# Patient Record
Sex: Male | Born: 2000 | Race: Black or African American | Hispanic: No | Marital: Single | Smoking: Never smoker
Health system: Southern US, Community
[De-identification: ages and names within clinical notes are randomized; demographics above are authoritative.]

## PROBLEM LIST (undated history)

## (undated) DIAGNOSIS — I517 Cardiomegaly: Secondary | ICD-10-CM

## (undated) HISTORY — PX: CIRCUMCISION: SUR203

---
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
## 2020-08-11 ENCOUNTER — Emergency Department: Payer: Self-pay

## 2020-08-11 ENCOUNTER — Other Ambulatory Visit: Payer: Self-pay

## 2020-08-11 DIAGNOSIS — Y92410 Unspecified street and highway as the place of occurrence of the external cause: Secondary | ICD-10-CM | POA: Diagnosis not present

## 2020-08-11 DIAGNOSIS — R519 Headache, unspecified: Secondary | ICD-10-CM | POA: Insufficient documentation

## 2020-08-11 DIAGNOSIS — S161XXA Strain of muscle, fascia and tendon at neck level, initial encounter: Secondary | ICD-10-CM | POA: Diagnosis not present

## 2020-08-11 DIAGNOSIS — S199XXA Unspecified injury of neck, initial encounter: Secondary | ICD-10-CM | POA: Diagnosis present

## 2020-08-11 DIAGNOSIS — M79652 Pain in left thigh: Secondary | ICD-10-CM | POA: Diagnosis not present
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
## 2020-08-12 ENCOUNTER — Emergency Department
Admission: EM | Admit: 2020-08-12 | Discharge: 2020-08-12 | Disposition: A | Discharge status: Home / Self Care | Payer: Self-pay | Attending: Emergency Medicine | Admitting: Emergency Medicine

## 2020-08-12 DIAGNOSIS — S161XXA Strain of muscle, fascia and tendon at neck level, initial encounter: Secondary | ICD-10-CM

## 2020-08-12 MED ORDER — IBUPROFEN 800 MG PO TABS
800.0000 mg | ORAL_TABLET | Freq: Once | ORAL | Status: AC
  Administered 2020-08-12: 800 mg via ORAL
  Filled 2020-08-12: qty 1

## 2020-08-12 MED ORDER — IBUPROFEN 800 MG PO TABS: 800.0000 mg | ORAL_TABLET | Freq: Three times a day (TID) | ORAL | 0 refills | Status: DC | PRN

## 2020-08-12 MED ORDER — CYCLOBENZAPRINE HCL 5 MG PO TABS: 5.0000 mg | ORAL_TABLET | Freq: Three times a day (TID) | ORAL | 0 refills | Status: DC | PRN

## 2020-08-12 MED ORDER — CYCLOBENZAPRINE HCL 10 MG PO TABS
5.0000 mg | ORAL_TABLET | Freq: Once | ORAL | Status: AC
  Administered 2020-08-12: 5 mg via ORAL
  Filled 2020-08-12: qty 1
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
## 2020-08-12 NOTE — ED Notes (Signed)
Patient ambulated to triage room without complaint. NAD noted.

## 2020-08-12 NOTE — Discharge Instructions (Signed)
1.  You may take Motrin and Flexeril as needed for pain and muscle spasms. 2.  Apply moist heat to affected area several times daily. 3.  Return to the ER for worsening symptoms, persistent vomiting, extremity numbness/tingling, difficulty breathing or other concerns.
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
## 2023-02-12 ENCOUNTER — Ambulatory Visit
Admission: RE | Admit: 2023-02-12 | Discharge: 2023-02-12 | Disposition: A | Discharge status: Home / Self Care | Payer: Medicaid Other | Source: Ambulatory Visit | Attending: Urgent Care | Admitting: Urgent Care

## 2023-02-12 ENCOUNTER — Ambulatory Visit: Payer: Self-pay

## 2023-02-12 VITALS — BP 144/85 | HR 77 | Temp 98.2°F | Resp 16

## 2023-02-12 DIAGNOSIS — M79662 Pain in left lower leg: Secondary | ICD-10-CM

## 2023-02-12 HISTORY — DX: Cardiomegaly: I51.7

## 2023-02-12 LAB — POCT URINALYSIS DIP (MANUAL ENTRY)
Bilirubin, UA: NEGATIVE
Glucose, UA: NEGATIVE mg/dL
Ketones, POC UA: NEGATIVE mg/dL
Nitrite, UA: NEGATIVE
Protein Ur, POC: 30 mg/dL — AB
Spec Grav, UA: >=1.03 — AB (ref 1.010–1.025)
Urobilinogen, UA: 0.2 E.U./dL
pH, UA: 5.5 (ref 5.0–8.0)

## 2023-02-12 NOTE — Discharge Instructions (Addendum)
The symptoms in your left leg need further evaluation, possibly including imaging.    We have drawn blood today to rule out an electrolyte imbalance.    Your options for additional evaluation are:  - establish care with a primary care doctor who can order additional tests for you.  Given your insurance is Medicaid, you have to have a provider assigned to you at the health department.  -  go to the emergency room and request evaluation of your symptoms

## 2023-02-12 NOTE — ED Provider Notes (Addendum)
Sergio Graham    CSN: [id number] Arrival date & time: 02/12/23  0854      History   Chief Complaint Chief Complaint  Patient presents with   Leg Injury    Entered by patient    HPI Sergio Graham is a 22 y.o. male.   HPI  Presents to UC with complaint of pain and cramping in the left posterior calf x 1 month.  Describes pain as "cramping" with waxing and waning intensity.  He states that the pain radiates into his lower back.  Pain is somewhat relieved by elevation however he feels a pulsing sensation when in a dependent position.  He denies warmth or swelling in the leg.  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 Authorizing Provider  cyclobenzaprine (FLEXERIL) 5 MG tablet Take 1 tablet (5 mg total) by mouth 3 (three) times daily as needed. 08/12/20   Irean Hong, MD  hydrocortisone cream 0.5 % Apply 1 application topically 2 (two) times daily. Patient not taking: Reported on 06/09/2018 12/12/15   Evangeline Dakin, PA-C  ibuprofen (ADVIL) 800 MG tablet Take 1 tablet (800 mg total) by mouth every 8 (eight) hours as needed for moderate pain. 08/12/20   Irean Hong, MD  naproxen (NAPROSYN) 500 MG tablet Take 1 tablet (500 mg total) by mouth 2 (two) times daily with a meal. Patient not taking: Reported on 06/09/2018 09/21/17   Joni Reining, PA-C  sulfamethoxazole-trimethoprim (BACTRIM DS,SEPTRA DS) 800-160 MG tablet Take 1 tablet by mouth 2 (two) times daily. Patient not taking: Reported on 06/09/2018 09/21/17   Joni Reining, PA-C    Family History Family History  Problem  Relation Age of Onset   Seizures Father    Migraines Neg Hx    Autism Neg Hx    ADD / ADHD Neg Hx    Anxiety disorder Neg Hx    Depression Neg Hx    Bipolar disorder Neg Hx    Schizophrenia Neg Hx     Social History Social History   Tobacco Use   Smoking status: Never   Smokeless tobacco: Never  Substance Use Topics   Alcohol use: No     Allergies   Patient has no known allergies.   Review of Systems Review of Systems   Physical Exam Triage Vital Signs ED Triage Vitals  Encounter Vitals Group     BP 02/12/23 0906 (!) 144/85     Systolic BP Percentile --      Diastolic BP Percentile --      Pulse Rate 02/12/23 0906 77     Resp 02/12/23 0906 16     Temp 02/12/23 0906 98.2 F (36.8 C)     Temp Source 02/12/23 0906 Oral     SpO2 02/12/23 0906 97 %     Weight --      Height --      Head Circumference --      Peak Flow --      Pain Score 02/12/23 0911 6  Pain Loc --      Pain Education --      Exclude from Growth Chart --    No data found.  Updated Vital Signs BP (!) 144/85 (BP Location: Right Arm)   Pulse 77   Temp 98.2 F (36.8 C) (Oral)   Resp 16   SpO2 97%   Visual Acuity Right Eye Distance:   Left Eye Distance:   Bilateral Distance:    Right Eye Near:   Left Eye Near:    Bilateral Near:     Physical Exam Vitals reviewed.  Cardiovascular:     Rate and Rhythm: Normal rate and regular rhythm.     Pulses: Normal pulses.     Heart sounds: Normal heart sounds.  Pulmonary:     Effort: Pulmonary effort is normal.     Breath sounds: Normal breath sounds.  Musculoskeletal:     Left lower leg: No swelling or tenderness. No edema.  Skin:    General: Skin is warm and dry.     Comments: Pedal pulses are present in the left foot.  He has good skin color.  Tissue is warm.  < 3-second capillary refill  Neurological:     General: No focal deficit present.     Mental Status: He is alert and oriented to person, place, and time.  Psychiatric:         Mood and Affect: Mood normal.        Behavior: Behavior normal.      UC Treatments / Results  Labs (all labs ordered are listed, but only abnormal results are displayed) Labs Reviewed - No data to display  EKG   Radiology No results found.  Procedures Procedures (including critical care time)  Medications Ordered in UC Medications - No data to display  Initial Impression / Assessment and Plan / UC Course  I have reviewed the triage vital signs and the nursing notes.  Pertinent labs & imaging results that were available during my care of the patient were reviewed by me and considered in my medical decision making (see chart for details).   Sergio Graham is a 22 y.o. male presenting with LLE pain. Patient is afebrile without recent antipyretics, satting well on room air. Overall is well appearing, well hydrated, without respiratory distress. Pulmonary exam is unremarkable.  Lungs CTAB without wheezing, rhonchi, rales. RRR without murmurs, rubs, gallops. Pedal pulses are present in the left foot.  He has good skin color.  Tissue is warm.  < 3-second capillary refill at his toes.  No lower left extremity tenderness or swelling.  Reviewed relevant chart history.   Unclear etiology for his symptoms.  Differential diagnoses include dehydration versus electrolyte imbalance.  More serious etiology would include DVT versus claudication which will require imaging not available in UC, followed by possible referral to vascular specialist.  Will draw labs and urine to assess hydration status and electrolytes.  Will recommend that patient follow-up with establishing care with a primary care provider or go to the ED for emergent evaluation.  Final Clinical Impressions(s) / UC Diagnoses   Final diagnoses:  None   Discharge Instructions   None    ED Prescriptions   None    PDMP not reviewed this encounter.   Charma Igo, FNP 02/12/23 0954    Charma Igo,  FNP 02/12/23 [phone]

## 2023-02-12 NOTE — ED Triage Notes (Signed)
Reports pain and cramping sensation in left posterior calf/behind knee x 1 month. Describes the pain as cramping in nature, constantly there but waxes and wanes in severity. Radiates into lower back. Elevating leg relieves some of the pain, but when in a dependent position the pain returns and becomes pulsating in nature. Denies warmth, swelling in leg. Denies fever, cough, SOB, palpitations, prolonged periods of sitting. 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 Uses ibuprofen and tylenol with minimal pain relief.

## 2023-02-13 LAB — CBC WITH DIFFERENTIAL/PLATELET
Basophils Absolute: 0 10*3/uL (ref 0.0–0.2)
Basos: 0 %
EOS (ABSOLUTE): 0.1 10*3/uL (ref 0.0–0.4)
Eos: 2 %
Hematocrit: 45.2 % (ref 37.5–51.0)
Hemoglobin: 15.1 g/dL (ref 13.0–17.7)
Immature Grans (Abs): 0 10*3/uL (ref 0.0–0.1)
Immature Granulocytes: 0 %
Lymphocytes Absolute: 2.1 10*3/uL (ref 0.7–3.1)
Lymphs: 34 %
MCH: 30.1 pg (ref 26.6–33.0)
MCHC: 33.4 g/dL (ref 31.5–35.7)
MCV: 90 fL (ref 79–97)
Monocytes Absolute: 0.6 10*3/uL (ref 0.1–0.9)
Monocytes: 9 %
Neutrophils Absolute: 3.4 10*3/uL (ref 1.4–7.0)
Neutrophils: 55 %
Platelets: 213 10*3/uL (ref 150–450)
RBC: 5.02 x10E6/uL (ref 4.14–5.80)
RDW: 12.7 % (ref 11.6–15.4)
WBC: 6.3 10*3/uL (ref 3.4–10.8)

## 2023-02-13 LAB — COMPREHENSIVE METABOLIC PANEL
ALT: 37 IU/L (ref 0–44)
AST: 25 IU/L (ref 0–40)
Albumin: 4.6 g/dL (ref 4.3–5.2)
Alkaline Phosphatase: 101 IU/L (ref 44–121)
BUN/Creatinine Ratio: 11 (ref 9–20)
BUN: 10 mg/dL (ref 6–20)
Bilirubin Total: 0.6 mg/dL (ref 0.0–1.2)
CO2: 24 mmol/L (ref 20–29)
Calcium: 9.6 mg/dL (ref 8.7–10.2)
Chloride: 103 mmol/L (ref 96–106)
Creatinine, Ser: 0.91 mg/dL (ref 0.76–1.27)
Globulin, Total: 2.6 g/dL (ref 1.5–4.5)
Glucose: 97 mg/dL (ref 70–99)
Potassium: 3.9 mmol/L (ref 3.5–5.2)
Sodium: 140 mmol/L (ref 134–144)
Total Protein: 7.2 g/dL (ref 6.0–8.5)
eGFR: 123 mL/min/{1.73_m2} (ref >59)

## 2023-02-13 LAB — MAGNESIUM: Magnesium: 2 mg/dL (ref 1.6–2.3)

## 2023-02-15 ENCOUNTER — Emergency Department
Admission: EM | Admit: 2023-02-15 | Discharge: 2023-02-15 | Disposition: A | Discharge status: Home / Self Care | Payer: Medicaid Other | Attending: Student in an Organized Health Care Education/Training Program | Admitting: Student in an Organized Health Care Education/Training Program

## 2023-02-15 ENCOUNTER — Emergency Department: Payer: Medicaid Other

## 2023-02-15 ENCOUNTER — Other Ambulatory Visit: Payer: Self-pay

## 2023-02-15 ENCOUNTER — Encounter: Payer: Self-pay | Admitting: Emergency Medicine

## 2023-02-15 DIAGNOSIS — S86912A Strain of unspecified muscle(s) and tendon(s) at lower leg level, left leg, initial encounter: Secondary | ICD-10-CM | POA: Diagnosis not present

## 2023-02-15 DIAGNOSIS — M79605 Pain in left leg: Secondary | ICD-10-CM | POA: Diagnosis present

## 2023-02-15 DIAGNOSIS — X58XXXA Exposure to other specified factors, initial encounter: Secondary | ICD-10-CM | POA: Insufficient documentation

## 2023-02-15 LAB — COMPREHENSIVE METABOLIC PANEL
ALT: 36 U/L (ref 0–44)
AST: 22 U/L (ref 15–41)
Albumin: 3.8 g/dL (ref 3.5–5.0)
Alkaline Phosphatase: 77 U/L (ref 38–126)
Anion gap: 7 (ref 5–15)
BUN: 17 mg/dL (ref 6–20)
CO2: 24 mmol/L (ref 22–32)
Calcium: 8.9 mg/dL (ref 8.9–10.3)
Chloride: 107 mmol/L (ref 98–111)
Creatinine, Ser: 0.84 mg/dL (ref 0.61–1.24)
GFR, Estimated: >60 mL/min (ref >60)
Glucose, Bld: 96 mg/dL (ref 70–99)
Potassium: 3.7 mmol/L (ref 3.5–5.1)
Sodium: 138 mmol/L (ref 135–145)
Total Bilirubin: 0.9 mg/dL (ref 0.3–1.2)
Total Protein: 7.5 g/dL (ref 6.5–8.1)

## 2023-02-15 LAB — CBC WITH DIFFERENTIAL/PLATELET
Abs Immature Granulocytes: 0.01 10*3/uL (ref 0.00–0.07)
Basophils Absolute: 0 10*3/uL (ref 0.0–0.1)
Basophils Relative: 0 %
Eosinophils Absolute: 0.2 10*3/uL (ref 0.0–0.5)
Eosinophils Relative: 3 %
HCT: 41.1 % (ref 39.0–52.0)
Hemoglobin: 14.3 g/dL (ref 13.0–17.0)
Immature Granulocytes: 0 %
Lymphocytes Relative: 46 %
Lymphs Abs: 2.4 10*3/uL (ref 0.7–4.0)
MCH: 30.8 pg (ref 26.0–34.0)
MCHC: 34.8 g/dL (ref 30.0–36.0)
MCV: 88.6 fL (ref 80.0–100.0)
Monocytes Absolute: 0.4 10*3/uL (ref 0.1–1.0)
Monocytes Relative: 8 %
Neutro Abs: 2.2 10*3/uL (ref 1.7–7.7)
Neutrophils Relative %: 43 %
Platelets: 198 10*3/uL (ref 150–400)
RBC: 4.64 MIL/uL (ref 4.22–5.81)
RDW: 11.5 % (ref 11.5–15.5)
WBC: 5.2 10*3/uL (ref 4.0–10.5)
nRBC: 0 % (ref 0.0–0.2)

## 2023-02-15 LAB — URINALYSIS, ROUTINE W REFLEX MICROSCOPIC
Bilirubin Urine: NEGATIVE
Glucose, UA: NEGATIVE mg/dL
Hgb urine dipstick: NEGATIVE
Ketones, ur: NEGATIVE mg/dL
Leukocytes,Ua: NEGATIVE
Nitrite: NEGATIVE
Protein, ur: NEGATIVE mg/dL
Specific Gravity, Urine: 1.008 (ref 1.005–1.030)
pH: 6 (ref 5.0–8.0)

## 2023-02-15 LAB — CK: Total CK: 256 U/L (ref 49–397)

## 2023-02-15 MED ORDER — MELOXICAM 15 MG PO TABS: 15.0000 mg | ORAL_TABLET | Freq: Every day | ORAL | 0 refills | Status: AC

## 2023-02-15 MED ORDER — BACLOFEN 10 MG PO TABS: 10.0000 mg | ORAL_TABLET | Freq: Three times a day (TID) | ORAL | 0 refills | Status: AC

## 2023-02-15 NOTE — ED Provider Notes (Signed)
[facility] Provider Note    Event Date/Time   First MD Initiated Contact with Patient 02/15/23 [phone]     (approximate)   History   Leg Pain   HPI  Sergio Graham is a 22 y.o. male with no significant past medical history presents emergency department with continued left leg pain.  Patient was seen at the urgent care on Thursday for the same complaint.  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  He states some areas are deconditioned and others are not.  Noticed that the leg pain started on Thursday has aggressively gotten worse.  No chest pain or shortness of breath.      Physical Exam   Triage Vital Signs: ED Triage Vitals  Encounter Vitals Group     BP 02/15/23 0719 137/86     Systolic BP Percentile --      Diastolic BP Percentile --      Pulse Rate 02/15/23 0719 70     Resp 02/15/23 0719 20     Temp 02/15/23 0719 98 F (36.7 C)     Temp Source 02/15/23 0719 Oral     SpO2 02/15/23 0719 100 %     Weight 02/15/23 0719 (!) 314 lb (142.4 kg)     Height 02/15/23 0719 6\' 1"  (1.854 m)     Head Circumference --      Peak Flow --      Pain Score 02/15/23 0718 8     Pain Loc --      Pain Education --      Exclude from Growth Chart --     Most recent vital signs: Vitals:   02/15/23 0719  BP: 137/86  Pulse: 70  Resp: 20  Temp: 98 F (36.7 C)  SpO2: 100%     General: Awake, no distress.   CV:  Good peripheral perfusion. regular rate and  rhythm Resp:  Normal effort.  Abd:  No distention.   Other:  Left calf tender to palpation, extends into the left knee and some into the left inner thigh   ED Results / Procedures / Treatments   Labs (all labs ordered are listed, but only abnormal results are displayed) Labs Reviewed  URINALYSIS, ROUTINE W REFLEX MICROSCOPIC - Abnormal; Notable for the following components:      Result Value   Color, Urine STRAW (*)    APPearance CLEAR (*)    All other  components within normal limits  COMPREHENSIVE METABOLIC PANEL  CBC WITH DIFFERENTIAL/PLATELET  CK     EKG     RADIOLOGY Ultrasound left lower extremity for DVT    PROCEDURES:   Procedures   MEDICATIONS ORDERED IN ED: Medications - No data to display   IMPRESSION / MDM / ASSESSMENT AND PLAN / ED COURSE  I reviewed the triage vital signs and the nursing notes.                              Differential diagnosis includes, but is not limited to, DVT, rhabdomyolysis, hypokalemia, muscle strain, dehydration  Patient's presentation is most consistent with acute presentation with potential threat to life or bodily function.   Due to progressive left leg/calf tenderness will go ahead and order ultrasound for DVT  In review of the patient's labs from the urgent care metabolic panel is normal however his urinalysis does show a large amount  of protein and some RBCs.  Therefore will evaluate him for rhabdomyolysis.  Labs drawn.  Will initiate fluids if needed.   Patient's labs are reassuring, urinalysis does not have any protein, CK is normal indicating that he does not have rhabdomyolysis.  Electrolytes are normal.  Ultrasound left lower extremity for DVT was independently reviewed interpreted by me as being negative for acute abnormality.  Do feel this is more of a muscle strain at this time.  Placed him on meloxicam and baclofen.  He is to use over-the-counter Tylenol if additional pain medicine as needed.  Drink plenty of water.  Return emergency department worsening.  See his regular doctor if not improving 3 to 4 days.  Patient is in agreement treatment plan.  He was discharged stable condition.   FINAL CLINICAL IMPRESSION(S) / ED DIAGNOSES   Final diagnoses:  Muscle strain of left lower leg, initial encounter     Rx / DC Orders   ED Discharge Orders          Ordered    meloxicam (MOBIC) 15 MG tablet  Daily        02/15/23 0921    baclofen (LIORESAL) 10 MG  tablet  3 times daily        02/15/23 1610             Note:  This document was prepared using Dragon voice recognition software and may include unintentional dictation errors.    Faythe Ghee, PA-C 02/15/23 [id number]    Willy Eddy, MD 02/15/23 1328

## 2023-02-15 NOTE — ED Notes (Signed)
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

## 2023-02-15 NOTE — Discharge Instructions (Signed)
Follow-up with your regular doctor if not improving to 3 days.  Return emergency department if worsening.  Take medications as prescribed.  Drink plenty of water.

## 2023-02-15 NOTE — ED Triage Notes (Signed)
Pt reports pain to his left leg, for while. Denies any injuries. Reports pain has been there for one month. Pt reports was seen for it but nothing was given or done. Pt reports works 12 hours shifts and moves and stands a lot. Denies swelling or discoloration.

## 2023-07-06 ENCOUNTER — Other Ambulatory Visit: Payer: Self-pay

## 2023-07-06 ENCOUNTER — Encounter: Payer: Self-pay | Admitting: Emergency Medicine

## 2023-07-06 DIAGNOSIS — Z5321 Procedure and treatment not carried out due to patient leaving prior to being seen by health care provider: Secondary | ICD-10-CM | POA: Diagnosis not present

## 2023-07-06 DIAGNOSIS — R300 Dysuria: Secondary | ICD-10-CM | POA: Insufficient documentation

## 2023-07-06 NOTE — ED Triage Notes (Signed)
Pt to ED from home c/o burning with urination, also states pain to head of penis with some swelling.  Denies hematuria or discharge.

## 2023-07-07 ENCOUNTER — Emergency Department
Admission: EM | Admit: 2023-07-07 | Discharge: 2023-07-07 | Discharge status: Against Medical Advice | Payer: Medicaid Other | Attending: Emergency Medicine | Admitting: Emergency Medicine
